# Patient Record
Sex: Male | Born: 1995 | Hispanic: No | Marital: Single | Smoking: Never smoker
Health system: Southern US, Community
[De-identification: ages and names within clinical notes are randomized; demographics above are authoritative.]

## PROBLEM LIST (undated history)

## (undated) DIAGNOSIS — F32A Depression, unspecified: Secondary | ICD-10-CM

## (undated) DIAGNOSIS — F419 Anxiety disorder, unspecified: Secondary | ICD-10-CM

## (undated) DIAGNOSIS — K219 Gastro-esophageal reflux disease without esophagitis: Secondary | ICD-10-CM

## (undated) DIAGNOSIS — F329 Major depressive disorder, single episode, unspecified: Secondary | ICD-10-CM

## (undated) HISTORY — DX: Anxiety disorder, unspecified: F41.9

## (undated) HISTORY — DX: Depression, unspecified: F32.A

## (undated) HISTORY — DX: Gastro-esophageal reflux disease without esophagitis: K21.9

---
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
## 2019-04-18 ENCOUNTER — Ambulatory Visit
Admission: EM | Admit: 2019-04-18 | Discharge: 2019-04-18 | Disposition: A | Discharge status: Home / Self Care | Payer: Self-pay | Attending: Emergency Medicine | Admitting: Emergency Medicine

## 2019-04-18 ENCOUNTER — Other Ambulatory Visit: Payer: Self-pay

## 2019-04-18 DIAGNOSIS — R1013 Epigastric pain: Secondary | ICD-10-CM

## 2019-04-18 DIAGNOSIS — F419 Anxiety disorder, unspecified: Secondary | ICD-10-CM

## 2019-04-18 DIAGNOSIS — M62838 Other muscle spasm: Secondary | ICD-10-CM

## 2019-04-18 DIAGNOSIS — F5105 Insomnia due to other mental disorder: Secondary | ICD-10-CM

## 2019-04-18 MED ORDER — OMEPRAZOLE 20 MG PO CPDR: 20.0000 mg | DELAYED_RELEASE_CAPSULE | Freq: Every day | ORAL | 0 refills | Status: DC

## 2019-04-18 MED ORDER — HYDROXYZINE HCL 25 MG PO TABS: 25.0000 mg | ORAL_TABLET | Freq: Every evening | ORAL | 0 refills | Status: AC | PRN

## 2019-04-18 MED ORDER — CYCLOBENZAPRINE HCL 5 MG PO TABS: 5.0000 mg | ORAL_TABLET | Freq: Two times a day (BID) | ORAL | 0 refills | Status: AC | PRN

## 2019-04-18 NOTE — ED Provider Notes (Signed)
[facility]    CSN: [id number] Arrival date & time: 04/18/19  1725     History   Chief Complaint Chief Complaint  Patient presents with   Chest Pain    HPI Gregory Wu is a 23 y.o. male with self-reported history of anxiety presenting for 1 week of right lower shoulder muscle spasms and bilateral anterior chest/upper abdominal discomfort.  Patient denies specific aggravating or alleviating factors for muscle spasms.  States that he notices a burning sensation specifically over his left upper abdomen again it radiates across his chest.  Patient denies retrosternal chest pain, shortness of breath, wheezing.  Patient states that this sensation is worse after eating salty foods.  Denies known history of GERD.  Patient also noting difficulty sleeping second to his anxiety.  Patient denies history of SI/HI, denies this currently.  Patient has not tried anything for any of his chief concerns today.   History reviewed. No pertinent past medical history.  Patient Active Problem List   Diagnosis Date Noted   Acute gangrenous appendicitis 06/21/2014    Past Surgical History:  Procedure Laterality Date   LAPAROSCOPIC APPENDECTOMY N/A 06/21/2014   Procedure: APPENDECTOMY LAPAROSCOPIC;  Surgeon: Judie PetitM. Leonia CoronaShuaib Farooqui, MD;  Location: [facility];  Service: Pediatrics;  Laterality: N/A;       Home Medications    Prior to Admission medications   Medication Sig Start Date End Date Taking? Authorizing Provider  cyclobenzaprine (FLEXERIL) 5 MG tablet Take 1 tablet (5 mg total) by mouth 2 (two) times daily as needed for up to 5 days for muscle spasms. Do NOT take with hydroxyzine 04/18/19 04/23/19  Hall-Potvin, [place], PA-C  hydrOXYzine (ATARAX/VISTARIL) 25 MG tablet Take 1 tablet (25 mg total) by mouth at bedtime as needed. Do NOT take with cyclobenzaprine 04/18/19   Hall-Potvin, [place], PA-C  omeprazole (PRILOSEC) 20 MG capsule Take 1 capsule (20 mg total) by mouth daily. 04/18/19    Hall-Potvin, [place], PA-C    Family History No family history on file.  Social History Social History   Tobacco Use   Smoking status: Never Smoker   Smokeless tobacco: Never Used  Substance Use Topics   Alcohol use: No   Drug use: No     Allergies   Patient has no known allergies.   Review of Systems Review of Systems  Constitutional: Negative for activity change, appetite change, fatigue and fever.  HENT: Negative for trouble swallowing and voice change.   Respiratory: Negative for cough and shortness of breath.   Cardiovascular: Negative for chest pain, palpitations and leg swelling.  Gastrointestinal: Positive for abdominal pain. Negative for abdominal distention, blood in stool, constipation, diarrhea, nausea and vomiting.  Musculoskeletal: Negative for arthralgias and myalgias.  Skin: Negative for rash and wound.  Neurological: Negative for speech difficulty and headaches.  Psychiatric/Behavioral: Positive for sleep disturbance. Negative for decreased concentration and suicidal ideas. The patient is nervous/anxious.   All other systems reviewed and are negative.    Physical Exam Triage Vital Signs ED Triage Vitals  Enc Vitals Group     BP 04/18/19 1745 135/76     Pulse Rate 04/18/19 1745 82     Resp 04/18/19 1745 18     Temp 04/18/19 1745 98.8 F (37.1 C)     Temp Source 04/18/19 1745 Oral     SpO2 04/18/19 1745 97 %     Weight --      Height --      Head Circumference --  Peak Flow --      Pain Score 04/18/19 1746 0     Pain Loc --      Pain Edu? --      Excl. in [place]? --    No data found.  Updated Vital Signs BP 135/76 (BP Location: Left Arm)    Pulse 82    Temp 98.8 F (37.1 C) (Oral)    Resp 18    SpO2 97%   Visual Acuity Right Eye Distance:   Left Eye Distance:   Bilateral Distance:    Right Eye Near:   Left Eye Near:    Bilateral Near:     Physical Exam Constitutional:      General: He is not in acute distress. HENT:      Head: Normocephalic and atraumatic.  Eyes:     General: No scleral icterus.    Pupils: Pupils are equal, round, and reactive to light.  Neck:     Musculoskeletal: Normal range of motion and neck supple.     Thyroid: No thyromegaly.     Vascular: No JVD.     Trachea: No tracheal deviation.  Cardiovascular:     Rate and Rhythm: Normal rate and regular rhythm.     Pulses:          Carotid pulses are 2+ on the right side and 2+ on the left side.      Radial pulses are 2+ on the right side and 2+ on the left side.  Pulmonary:     Effort: Pulmonary effort is normal. No respiratory distress.     Breath sounds: Normal breath sounds.  Chest:     Chest wall: No deformity, tenderness, crepitus or edema. There is no dullness to percussion.  Abdominal:     General: Bowel sounds are normal. There is no abdominal bruit.     Palpations: Abdomen is soft. There is no hepatomegaly or splenomegaly.     Tenderness: There is no guarding or rebound.     Comments: Mildly tender across upper abdomen  Musculoskeletal:     Comments: Right inferomedial shoulder with trace edema: No ecchymosis, erythema, or warmth.  Mildly tender to palpation.  Shoulders with full active ROM 5/5 strength bilaterally.  NVI  Lymphadenopathy:     Cervical: No cervical adenopathy.  Skin:    Capillary Refill: Capillary refill takes less than 2 seconds.     Coloration: Skin is not jaundiced or pale.  Neurological:     General: No focal deficit present.     Mental Status: He is alert and oriented to person, place, and time.  Psychiatric:        Mood and Affect: Mood is not anxious.        Behavior: Behavior normal.      UC Treatments / Results  Labs (all labs ordered are listed, but only abnormal results are displayed) Labs Reviewed - No data to display  EKG   Radiology No results found.  Procedures Procedures (including critical care time)  Medications Ordered in UC Medications - No data to display  Initial  Impression / Assessment and Plan / UC Course  I have reviewed the triage vital signs and the nursing notes.  Pertinent labs & imaging results that were available during my care of the patient were reviewed by me and considered in my medical decision making (see chart for details).     1.  Muscle spasm right shoulder We will try cyclobenzaprine.  Low concern for electrolyte  imbalance/dehydration given patient's vitals, exam, history.  2.  Hypersomnia associated with anxiety Will trial hydroxyzine for anxious state at bedtime.  Patient to follow-up with PCP for further evaluation/management.  3.  Epigastric burning sensation Will trial PPI.  Patient to keep symptom log and follow-up with PCP.  Return precautions discussed, patient verbalized understanding and is agreeable to plan. Final Clinical Impressions(s) / UC Diagnoses   Final diagnoses:  Muscle spasm of right shoulder  Hyposomnia, insomnia, or sleeplessness associated with anxiety  Epigastric burning sensation     Discharge Instructions     Take medications as prescribed/discussed today. Important to schedule PCP appointment for routine care. Return for worsening symptoms, chest pain, difficulty breathing.    ED Prescriptions    Medication Sig Dispense Auth. Provider   hydrOXYzine (ATARAX/VISTARIL) 25 MG tablet Take 1 tablet (25 mg total) by mouth at bedtime as needed. Do NOT take with cyclobenzaprine 12 tablet Hall-Potvin, [place], PA-C   cyclobenzaprine (FLEXERIL) 5 MG tablet Take 1 tablet (5 mg total) by mouth 2 (two) times daily as needed for up to 5 days for muscle spasms. Do NOT take with hydroxyzine 10 tablet Hall-Potvin, [place], PA-C   omeprazole (PRILOSEC) 20 MG capsule Take 1 capsule (20 mg total) by mouth daily. 30 capsule Hall-Potvin, [place], PA-C     Controlled Substance Prescriptions [place] Controlled Substance Registry consulted? Not Applicable   Shea EvansHall-Potvin, Jarielys Girardot, [place]-C 04/18/19 [id number]

## 2019-04-18 NOTE — ED Triage Notes (Signed)
Pt c/o rib pain all the way around, front to back x1wk, denies injury.

## 2019-04-18 NOTE — Discharge Instructions (Signed)
Take medications as prescribed/discussed today. Important to schedule PCP appointment for routine care. Return for worsening symptoms, chest pain, difficulty breathing.

## 2019-04-25 ENCOUNTER — Other Ambulatory Visit: Payer: Self-pay

## 2019-04-25 ENCOUNTER — Ambulatory Visit: Payer: Self-pay | Admitting: Internal Medicine

## 2019-04-25 ENCOUNTER — Encounter: Payer: Self-pay | Admitting: Internal Medicine

## 2019-04-25 VITALS — BP 137/80 | HR 100 | Temp 98.5°F | Ht 74.0 in | Wt 141.4 lb

## 2019-04-25 DIAGNOSIS — K219 Gastro-esophageal reflux disease without esophagitis: Secondary | ICD-10-CM

## 2019-04-25 DIAGNOSIS — F411 Generalized anxiety disorder: Secondary | ICD-10-CM

## 2019-04-25 DIAGNOSIS — Z79899 Other long term (current) drug therapy: Secondary | ICD-10-CM

## 2019-04-25 NOTE — Progress Notes (Signed)
   CC: epigastric pain, anxiety  HPI:  Gregory Wu is a 23 y.o. otherwise healthy gentleman who presents to establish care in the [facility] for further management of epigastric pain and anxiety. He was seen in the ED a week ago for epigastric pain and was started on Omeprazole 20 mg daily. His symptoms have improved since then, but will still have intermittent burning, particularly with spicy foods.   Patient also endorses symptoms of anxiety he has had ever since high school. Endorses excessive worry about life situations, difficulty falling asleep at night due to racing thoughts and occasional difficulty concentrating. He has worked with a Social worker in the past which he found helpful.   Family History: parents and younger sister are both healthy.   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 Endorses almost daily marijuana use, but has not smoked any since ED visit.   Past Surgical History:  Procedure Laterality Date  . LAPAROSCOPIC APPENDECTOMY N/A 06/21/2014   Procedure: APPENDECTOMY LAPAROSCOPIC;  Surgeon: Jerilynn Mages. Gerald Stabs, MD;  Location: [place];  Service: Pediatrics;  Laterality: N/A;     Review of Systems:   Review of Systems  Constitutional: Negative for chills and fever.  HENT: Negative for sinus pain and sore throat.   Eyes: Negative for blurred vision.  Respiratory: Negative for cough and shortness of breath.   Cardiovascular: Negative for chest pain and palpitations.  Gastrointestinal: Negative for blood in stool, constipation and melena.  Genitourinary: Negative for dysuria and hematuria.  Musculoskeletal: Negative for joint pain.  Neurological: Negative for dizziness, sensory change and headaches.    Physical Exam:  Vitals:   04/25/19 1044  BP: 137/80  Pulse: 100  Temp: 98.5 F (36.9 C)  TempSrc: Oral  SpO2: 100%  Weight: 141 lb 6.4 oz (64.1 kg)   Physical Exam Constitutional:    Appearance: Normal appearance. He is normal weight.  Eyes:     Conjunctiva/sclera: Conjunctivae normal.  Neck:     Musculoskeletal: Normal range of motion and neck supple.  Cardiovascular:     Rate and Rhythm: Normal rate and regular rhythm.     Pulses: Normal pulses.  Pulmonary:     Effort: Pulmonary effort is normal.     Breath sounds: Normal breath sounds.  Abdominal:     General: Abdomen is flat. Bowel sounds are normal. There is no distension.     Palpations: Abdomen is soft.     Tenderness: There is abdominal tenderness in the epigastric area.  Musculoskeletal: Normal range of motion.     Right lower leg: No edema.     Left lower leg: No edema.  Lymphadenopathy:     Cervical: No cervical adenopathy.  Skin:    General: Skin is warm and dry.  Neurological:     General: No focal deficit present.     Mental Status: He is alert and oriented to person, place, and time.      Assessment & Plan:   See Encounters Tab for problem based charting.  Patient discussed with Dr. Dareen Piano

## 2019-04-25 NOTE — Patient Instructions (Addendum)
Gregory Wu,  It was great meeting you! We are glad to have you establishing your care with [place].   Today we discussed:  Acid reflux - Since your symptoms are improving on the omeprazole, we will continue this dosing and see how you do over the next 2 months. Try to avoid foods that you notice make your symptoms worse. You can also take over-the-counter antacids such as Tums to help relieve your symptoms if needed.   Your anxiety - I'm glad you have counselor you can talk to. You have decided to reach back out to her before starting any medication which is perfectly reasonable. We will check in on this when you follow-up in about 2 months.   Please call sooner if you have any questions or concerns.   Take care!  Dr. Koleen Distance  Gastroesophageal Reflux Disease, Adult Gastroesophageal reflux (GER) happens when acid from the stomach flows up into the tube that connects the mouth and the stomach (esophagus). Normally, food travels down the esophagus and stays in the stomach to be digested. However, when a person has GER, food and stomach acid sometimes move back up into the esophagus. If this becomes a more serious problem, the person may be diagnosed with a disease called gastroesophageal reflux disease (GERD). GERD occurs when the reflux:  Happens often.  Causes frequent or severe symptoms.  Causes problems such as damage to the esophagus. When stomach acid comes in contact with the esophagus, the acid may cause soreness (inflammation) in the esophagus. Over time, GERD may create small holes (ulcers) in the lining of the esophagus. What are the causes? This condition is caused by a problem with the muscle between the esophagus and the stomach (lower esophageal sphincter, or LES). Normally, the LES muscle closes after food passes through the esophagus to the stomach. When the LES is weakened or abnormal, it does not close properly, and that allows food and stomach acid to go back up into the  esophagus. The LES can be weakened by certain dietary substances, medicines, and medical conditions, including:  Tobacco use.  Pregnancy.  Having a hiatal hernia.  Alcohol use.  Certain foods and beverages, such as coffee, chocolate, onions, and peppermint. What increases the risk? You are more likely to develop this condition if you:  Have an increased body weight.  Have a connective tissue disorder.  Use NSAID medicines. What are the signs or symptoms? Symptoms of this condition include:  Heartburn.  Difficult or painful swallowing.  The feeling of having a lump in the throat.  Abitter taste in the mouth.  Bad breath.  Having a large amount of saliva.  Having an upset or bloated stomach.  Belching.  Chest pain. Different conditions can cause chest pain. Make sure you see your health care provider if you experience chest pain.  Shortness of breath or wheezing.  Ongoing (chronic) cough or a night-time cough.  Wearing away of tooth enamel.  Weight loss. How is this diagnosed? Your health care provider will take a medical history and perform a physical exam. To determine if you have mild or severe GERD, your health care provider may also monitor how you respond to treatment. You may also have tests, including:  A test to examine your stomach and esophagus with a small camera (endoscopy).  A test thatmeasures the acidity level in your esophagus.  A test thatmeasures how much pressure is on your esophagus.  A barium swallow or modified barium swallow test to show the shape, size,  and functioning of your esophagus. How is this treated? The goal of treatment is to help relieve your symptoms and to prevent complications. Treatment for this condition may vary depending on how severe your symptoms are. Your health care provider may recommend:  Changes to your diet.  Medicine.  Surgery. Follow these instructions at home: Eating and drinking   Follow a  diet as recommended by your health care provider. This may involve avoiding foods and drinks such as: ? Coffee and tea (with or without caffeine). ? Drinks that containalcohol. ? Energy drinks and sports drinks. ? Carbonated drinks or sodas. ? Chocolate and cocoa. ? Peppermint and mint flavorings. ? Garlic and onions. ? Horseradish. ? Spicy and acidic foods, including peppers, chili powder, curry powder, vinegar, hot sauces, and barbecue sauce. ? Citrus fruit juices and citrus fruits, such as oranges, lemons, and limes. ? Tomato-based foods, such as red sauce, chili, salsa, and pizza with red sauce. ? Fried and fatty foods, such as donuts, french fries, potato chips, and high-fat dressings. ? High-fat meats, such as hot dogs and fatty cuts of red and white meats, such as rib eye steak, sausage, ham, and bacon. ? High-fat dairy items, such as whole milk, butter, and cream cheese.  Eat small, frequent meals instead of large meals.  Avoid drinking large amounts of liquid with your meals.  Avoid eating meals during the 2-3 hours before bedtime.  Avoid lying down right after you eat.  Do not exercise right after you eat. Lifestyle   Do not use any products that contain nicotine or tobacco, such as cigarettes, e-cigarettes, and chewing tobacco. If you need help quitting, ask your health care provider.  Try to reduce your stress by using methods such as yoga or meditation. If you need help reducing stress, ask your health care provider.  If you are overweight, reduce your weight to an amount that is healthy for you. Ask your health care provider for guidance about a safe weight loss goal. General instructions  Pay attention to any changes in your symptoms.  Take over-the-counter and prescription medicines only as told by your health care provider. Do not take aspirin, ibuprofen, or other NSAIDs unless your health care provider told you to do so.  Wear loose-fitting clothing. Do not  wear anything tight around your waist that causes pressure on your abdomen.  Raise (elevate) the head of your bed about 6 inches (15 cm).  Avoid bending over if this makes your symptoms worse.  Keep all follow-up visits as told by your health care provider. This is important. Contact a health care provider if:  You have: ? New symptoms. ? Unexplained weight loss. ? Difficulty swallowing or it hurts to swallow. ? Wheezing or a persistent cough. ? A hoarse voice.  Your symptoms do not improve with treatment. Get help right away if you:  Have pain in your arms, neck, jaw, teeth, or back.  Feel sweaty, dizzy, or light-headed.  Have chest pain or shortness of breath.  Vomit and your vomit looks like blood or coffee grounds.  Faint.  Have stool that is bloody or black.  Cannot swallow, drink, or eat. Summary  Gastroesophageal reflux happens when acid from the stomach flows up into the esophagus. GERD is a disease in which the reflux happens often, causes frequent or severe symptoms, or causes problems such as damage to the esophagus.  Treatment for this condition may vary depending on how severe your symptoms are. Your health care provider  may recommend diet and lifestyle changes, medicine, or surgery.  Contact a health care provider if you have new or worsening symptoms.  Take over-the-counter and prescription medicines only as told by your health care provider. Do not take aspirin, ibuprofen, or other NSAIDs unless your health care provider told you to do so.  Keep all follow-up visits as told by your health care provider. This is important. This information is not intended to replace advice given to you by your health care provider. Make sure you discuss any questions you have with your health care provider. Document Released: 06/09/2005 Document Revised: 03/08/2018 Document Reviewed: 03/08/2018 Elsevier Patient Education  2020 [employer]

## 2019-04-28 ENCOUNTER — Encounter: Payer: Self-pay | Admitting: Internal Medicine

## 2019-04-28 DIAGNOSIS — F411 Generalized anxiety disorder: Secondary | ICD-10-CM | POA: Insufficient documentation

## 2019-04-28 DIAGNOSIS — K219 Gastro-esophageal reflux disease without esophagitis: Secondary | ICD-10-CM | POA: Insufficient documentation

## 2019-04-28 NOTE — Assessment & Plan Note (Signed)
Patient endorses improvement since starting Omeprazole 20 mg daily 1 week ago. He denies any weight loss or dysphagia. Encouraged him to make note of which foods trigger his symptoms and try to avoid them when he can.  - Will continue Omeprazole 20 mg daily; OTC antacids PRN - Return in 4-6 weeks to evaluate symptom control

## 2019-04-28 NOTE — Assessment & Plan Note (Signed)
GAD-7 score is 12 today. Has had success with counseling in the past. We discussed initiating SSRI, but he would prefer to try working with his counselor again prior to starting any medications.  Will re-evaluate symptoms at follow-up in 4-6 weeks.

## 2019-04-29 ENCOUNTER — Encounter (HOSPITAL_COMMUNITY): Payer: Self-pay

## 2019-04-29 ENCOUNTER — Emergency Department (HOSPITAL_COMMUNITY): Payer: Self-pay

## 2019-04-29 ENCOUNTER — Emergency Department (HOSPITAL_COMMUNITY)
Admission: EM | Admit: 2019-04-29 | Discharge: 2019-04-30 | Disposition: A | Discharge status: Home / Self Care | Payer: Self-pay | Attending: Emergency Medicine | Admitting: Emergency Medicine

## 2019-04-29 ENCOUNTER — Other Ambulatory Visit: Payer: Self-pay

## 2019-04-29 DIAGNOSIS — K2970 Gastritis, unspecified, without bleeding: Secondary | ICD-10-CM | POA: Insufficient documentation

## 2019-04-29 DIAGNOSIS — K2900 Acute gastritis without bleeding: Secondary | ICD-10-CM

## 2019-04-29 DIAGNOSIS — R1013 Epigastric pain: Secondary | ICD-10-CM | POA: Insufficient documentation

## 2019-04-29 DIAGNOSIS — R197 Diarrhea, unspecified: Secondary | ICD-10-CM | POA: Insufficient documentation

## 2019-04-29 DIAGNOSIS — Z20828 Contact with and (suspected) exposure to other viral communicable diseases: Secondary | ICD-10-CM | POA: Insufficient documentation

## 2019-04-29 LAB — URINALYSIS, ROUTINE W REFLEX MICROSCOPIC
Bilirubin Urine: NEGATIVE
Glucose, UA: NEGATIVE mg/dL
Hgb urine dipstick: NEGATIVE
Ketones, ur: NEGATIVE mg/dL
Leukocytes,Ua: NEGATIVE
Nitrite: NEGATIVE
Protein, ur: NEGATIVE mg/dL
Specific Gravity, Urine: 1.004 — ABNORMAL LOW (ref 1.005–1.030)
pH: 7 (ref 5.0–8.0)

## 2019-04-29 LAB — COMPREHENSIVE METABOLIC PANEL
ALT: 26 U/L (ref 0–44)
AST: 32 U/L (ref 15–41)
Albumin: 4.9 g/dL (ref 3.5–5.0)
Alkaline Phosphatase: 84 U/L (ref 38–126)
Anion gap: 13 (ref 5–15)
BUN: 9 mg/dL (ref 6–20)
CO2: 24 mmol/L (ref 22–32)
Calcium: 9.8 mg/dL (ref 8.9–10.3)
Chloride: 102 mmol/L (ref 98–111)
Creatinine, Ser: 1.14 mg/dL (ref 0.61–1.24)
GFR calc Af Amer: >60 mL/min (ref >60)
GFR calc non Af Amer: >60 mL/min (ref >60)
Glucose, Bld: 136 mg/dL — ABNORMAL HIGH (ref 70–99)
Potassium: 3.3 mmol/L — ABNORMAL LOW (ref 3.5–5.1)
Sodium: 139 mmol/L (ref 135–145)
Total Bilirubin: 1 mg/dL (ref 0.3–1.2)
Total Protein: 7.2 g/dL (ref 6.5–8.1)

## 2019-04-29 LAB — CBC
HCT: 47.3 % (ref 39.0–52.0)
Hemoglobin: 16.4 g/dL (ref 13.0–17.0)
MCH: 32 pg (ref 26.0–34.0)
MCHC: 34.7 g/dL (ref 30.0–36.0)
MCV: 92.4 fL (ref 80.0–100.0)
Platelets: 148 10*3/uL — ABNORMAL LOW (ref 150–400)
RBC: 5.12 MIL/uL (ref 4.22–5.81)
RDW: 12 % (ref 11.5–15.5)
WBC: 7.5 10*3/uL (ref 4.0–10.5)
nRBC: 0 % (ref 0.0–0.2)

## 2019-04-29 LAB — LIPASE, BLOOD: Lipase: 20 U/L (ref 11–51)

## 2019-04-29 MED ORDER — SODIUM CHLORIDE 0.9% FLUSH: 3.0000 mL | Freq: Once | INTRAVENOUS | Status: DC

## 2019-04-29 MED ORDER — ALUM & MAG HYDROXIDE-SIMETH 200-200-20 MG/5ML PO SUSP
30.0000 mL | Freq: Once | ORAL | Status: AC
  Administered 2019-04-30: 30 mL via ORAL
  Filled 2019-04-29: qty 30

## 2019-04-29 MED ORDER — ONDANSETRON 4 MG PO TBDP
4.0000 mg | ORAL_TABLET | Freq: Once | ORAL | Status: AC
  Administered 2019-04-30: 4 mg via ORAL
  Filled 2019-04-29: qty 1

## 2019-04-29 NOTE — ED Provider Notes (Signed)
[facility] EMERGENCY DEPARTMENT Provider Note   CSN: [id number] Arrival date & time: 04/29/19  2126    History   Chief Complaint Chief Complaint  Patient presents with  . Abdominal Pain    HPI Gregory Wu is a 23 y.o. male with a hx of GERD, anxiety, appendectomy presents to the Emergency Department complaining of intermittent epigastric abd burning that radiates into his chest onset 2 weeks ago.  Pt reports symptoms worsened yesterday.  He reports eating oatmeal, crackers and watermelon today. Associated symptoms include 1 episode of NBNB emesis just before arrival and some intermittent loose stools which began almost 1 month ago.  Pt reports pain is worse after eating and at night. Pt reports laying flat seems to worsen his symptoms.  Pt has been seen by UC and PCP for this.  Prescribed omeprazole with some relief.  He has never seen GI.  Pt denies fever, headache, neck pain, weakness, syncope, dysuria.  Pt denies recent international travel, camping, new or contaminated foods.  Pt reports occasional alcohol usage with his last intake being last weekend.  He denies regular NSAID usage.  Pt does report continued anxiety.  He reports diarrhea usually occurs with increased anxiety.  He has been taking hydroxyzine with some relief.  Patient also reports some increased anxiety in the last few weeks.      The history is provided by the patient and medical records. No language interpreter was used.    History reviewed. No pertinent past medical history.  Patient Active Problem List   Diagnosis Date Noted  . Gastroesophageal reflux disease 04/28/2019  . Generalized anxiety disorder 04/28/2019  . Acute gangrenous appendicitis 06/21/2014    Past Surgical History:  Procedure Laterality Date  . LAPAROSCOPIC APPENDECTOMY N/A 06/21/2014   Procedure: APPENDECTOMY LAPAROSCOPIC;  Surgeon: Jerilynn Mages. Gerald Stabs, MD;  Location: [facility];  Service: Pediatrics;  Laterality: N/A;         Home Medications    Prior to Admission medications   Medication Sig Start Date End Date Taking? Authorizing Provider  hydrOXYzine (ATARAX/VISTARIL) 25 MG tablet Take 1 tablet (25 mg total) by mouth at bedtime as needed. Do NOT take with cyclobenzaprine 04/18/19   Hall-Potvin, [place], PA-C  omeprazole (PRILOSEC) 20 MG capsule Take 1 capsule (20 mg total) by mouth daily. 04/18/19   Hall-Potvin, [place], PA-C  ondansetron (ZOFRAN ODT) 4 MG disintegrating tablet 4mg  ODT q4 hours prn nausea/vomit 04/30/19   Estus Krakowski, Jarrett Soho, PA-C  sucralfate (CARAFATE) 1 g tablet Take 1 tablet (1 g total) by mouth 4 (four) times daily -  with meals and at bedtime. 04/30/19   Vanessa Alesi, Jarrett Soho, PA-C    Family History No family history on file.  Social History Social History   Tobacco Use  . Smoking status: Never Smoker  . Smokeless tobacco: Never Used  Substance Use Topics  . Alcohol use: No  . Drug use: No     Allergies   Patient has no known allergies.   Review of Systems Review of Systems  Constitutional: Positive for fatigue. Negative for appetite change, diaphoresis, fever and unexpected weight change.  HENT: Negative for mouth sores.   Eyes: Negative for visual disturbance.  Respiratory: Negative for cough, chest tightness, shortness of breath and wheezing.   Cardiovascular: Positive for chest pain.  Gastrointestinal: Positive for abdominal pain, nausea and vomiting (x1). Negative for constipation and diarrhea.  Endocrine: Negative for polydipsia, polyphagia and polyuria.  Genitourinary: Negative for dysuria, frequency, hematuria and urgency.  Musculoskeletal: Negative for back pain and neck stiffness.  Skin: Negative for rash.  Allergic/Immunologic: Negative for immunocompromised state.  Neurological: Negative for syncope, light-headedness and headaches.  Hematological: Does not bruise/bleed easily.  Psychiatric/Behavioral: Negative for sleep disturbance. The patient is  nervous/anxious.      Physical Exam Updated Vital Signs BP 116/75 (BP Location: Left Arm)   Pulse 79   Temp 97.9 F (36.6 C) (Oral)   Resp 16   SpO2 97%   Physical Exam Vitals signs and nursing note reviewed.  Constitutional:      General: He is not in acute distress.    Appearance: He is not diaphoretic.  HENT:     Head: Normocephalic.  Eyes:     General: No scleral icterus.    Conjunctiva/sclera: Conjunctivae normal.  Neck:     Musculoskeletal: Normal range of motion.  Cardiovascular:     Rate and Rhythm: Normal rate and regular rhythm.     Pulses: Normal pulses.          Radial pulses are 2+ on the right side and 2+ on the left side.  Pulmonary:     Effort: No tachypnea, accessory muscle usage, prolonged expiration, respiratory distress or retractions.     Breath sounds: No stridor.     Comments: Equal chest rise. No increased work of breathing. Abdominal:     General: There is no distension.     Palpations: Abdomen is soft.     Tenderness: There is no abdominal tenderness. There is no guarding or rebound.  Musculoskeletal:     Comments: Moves all extremities equally and without difficulty.  Skin:    General: Skin is warm and dry.     Capillary Refill: Capillary refill takes less than 2 seconds.  Neurological:     Mental Status: He is alert.     GCS: GCS eye subscore is 4. GCS verbal subscore is 5. GCS motor subscore is 6.     Comments: Speech is clear and goal oriented.  Psychiatric:        Mood and Affect: Mood is anxious.      ED Treatments / Results  Labs (all labs ordered are listed, but only abnormal results are displayed) Labs Reviewed  COMPREHENSIVE METABOLIC PANEL - Abnormal; Notable for the following components:      Result Value   Potassium 3.3 (*)    Glucose, Bld 136 (*)    All other components within normal limits  CBC - Abnormal; Notable for the following components:   Platelets 148 (*)    All other components within normal limits   URINALYSIS, ROUTINE W REFLEX MICROSCOPIC - Abnormal; Notable for the following components:   Color, Urine STRAW (*)    Specific Gravity, Urine 1.004 (*)    All other components within normal limits  SARS CORONAVIRUS 2 (HOSPITAL ORDER, PERFORMED IN [place] [facility])  LIPASE, BLOOD     Radiology Dg Chest Port 1 View  Result Date: 04/30/2019 CLINICAL DATA:  Chest pain, epigastric pain EXAM: PORTABLE CHEST 1 VIEW COMPARISON:  None. FINDINGS: The heart size and mediastinal contours are within normal limits. Both lungs are clear. No pneumothorax or effusion the portion of the right costophrenic sulcus is collimated from view. The visualized skeletal structures are unremarkable. IMPRESSION: No active disease. Electronically Signed   By: Kreg ShropshirePrice  DeHay M.D.   On: 04/30/2019 00:05    Procedures Procedures (including critical care time)  Medications Ordered in ED Medications  sodium chloride flush (NS)  0.9 % injection 3 mL (3 mLs Intravenous Not Given 04/30/19 0010)  ondansetron (ZOFRAN-ODT) disintegrating tablet 4 mg (has no administration in time range)  alum & mag hydroxide-simeth (MAALOX/MYLANTA) 200-200-20 MG/5ML suspension 30 mL (30 mLs Oral Given 04/30/19 0014)  ondansetron (ZOFRAN-ODT) disintegrating tablet 4 mg (4 mg Oral Given 04/30/19 0014)  potassium chloride SA (K-DUR) CR tablet 40 mEq (40 mEq Oral Given 04/30/19 0146)     Initial Impression / Assessment and Plan / ED Course  I have reviewed the triage vital signs and the nursing notes.  Pertinent labs & imaging results that were available during my care of the patient were reviewed by me and considered in my medical decision making (see chart for details).        Patient presents to the emergency department with complaints of epigastric pain that occasionally radiates into his chest.  It is described as burning, worse at night and worse when he is anxious.  He has no fevers or known exposures to COVID.  Patient reports one  episode of nonbloody nonbilious emesis prior to arrival.  He is very anxious.  Abdomen is soft and nontender on exam.  Labs are all reassuring.  Chest x-ray without evidence of free air to suggest perforated peptic ulcer.  No elevation in lipase to suggest pancreatitis.  No pain to the right upper quadrant, no elevated liver enzymes to suggest cholecystitis.  Suspect gastritis as the cause of patient's symptoms.  He is taking omeprazole at home.  Will add Carafate.  Will refer to GI for EGD.  No clinical evidence of diverticulitis, colitis.  Patient has history of appendectomy.  Patient given Zofran and GI cocktail with significant improvement.  Mild hypokalemia was noted.  Potassium given.  Approximately 20 minutes after potassium administration, patient had additional bout of emesis.  Suspect this was secondary to potassium.  Will give additional Zofran and p.o. trial with crackers.  02:15AM Patient reports he is feeling better and wishes for discharge home.  He remains without return of abdominal pain.  Final Clinical Impressions(s) / ED Diagnoses   Final diagnoses:  Epigastric pain  Acute gastritis, presence of bleeding unspecified, unspecified gastritis type    ED Discharge Orders         Ordered    sucralfate (CARAFATE) 1 g tablet  3 times daily with meals & bedtime     04/30/19 0138    ondansetron (ZOFRAN ODT) 4 MG disintegrating tablet  Status:  Discontinued     04/30/19 0138    ondansetron (ZOFRAN ODT) 4 MG disintegrating tablet     04/30/19 0139           Cordie Beazley, Dahlia ClientHannah, PA-C 04/30/19 0503    Cardama, Amadeo GarnetPedro Eduardo, MD 04/30/19 [phone]

## 2019-04-29 NOTE — ED Triage Notes (Signed)
Pt states that for the past two weeks he has been feeling bad went to UC and given meds, has now been feeling worse, reports not eating since sat, weakness, abd pain, nasuea, diarrhea, chills.

## 2019-04-30 LAB — SARS CORONAVIRUS 2 BY RT PCR (HOSPITAL ORDER, PERFORMED IN ~~LOC~~ HOSPITAL LAB): SARS Coronavirus 2: NEGATIVE

## 2019-04-30 MED ORDER — ONDANSETRON 4 MG PO TBDP
4.0000 mg | ORAL_TABLET | Freq: Once | ORAL | Status: AC
  Administered 2019-04-30: 02:00:00 4 mg via ORAL
  Filled 2019-04-30: qty 1

## 2019-04-30 MED ORDER — SUCRALFATE 1 G PO TABS: 1.0000 g | ORAL_TABLET | Freq: Three times a day (TID) | ORAL | 0 refills | Status: AC

## 2019-04-30 MED ORDER — ONDANSETRON 4 MG PO TBDP: ORAL_TABLET | ORAL | 0 refills | Status: DC

## 2019-04-30 MED ORDER — ONDANSETRON 4 MG PO TBDP: ORAL_TABLET | ORAL | 0 refills | Status: AC

## 2019-04-30 MED ORDER — POTASSIUM CHLORIDE CRYS ER 20 MEQ PO TBCR
40.0000 meq | EXTENDED_RELEASE_TABLET | Freq: Once | ORAL | Status: AC
  Administered 2019-04-30: 02:00:00 40 meq via ORAL
  Filled 2019-04-30: qty 2

## 2019-04-30 NOTE — ED Notes (Signed)
As pt got into wheelchair to be discharged, pt emesis x1. EDP notified. Pt given zofran, crackers, per EDP. Pt reports decreased nausea.

## 2019-04-30 NOTE — Progress Notes (Signed)
Internal Medicine Clinic Attending  Case discussed with Dr. Bloomfield at the time of the visit.  We reviewed the resident's history and exam and pertinent patient test results.  I agree with the assessment, diagnosis, and plan of care documented in the resident's note.  

## 2019-04-30 NOTE — Discharge Instructions (Addendum)
1. Medications: Continue Omeprazole, use Hydroxyzine as needed for anxiety, take Carafate for burning pain, take zofran for nausea or vomiting, usual home medications 2. Treatment: rest, drink plenty of fluids, advance diet slowly 3. Follow Up: Please followup with your primary doctor and gastroenterology in 1 week for discussion of your diagnoses and further evaluation after today's visit; Please return to the ER for persistent vomiting, worsening pain, high fevers or concerning symptoms

## 2019-04-30 NOTE — ED Notes (Signed)
Pt reports decreased nausea, EPD notified. Pt and EDP ok with pt d/c.

## 2019-04-30 NOTE — ED Notes (Signed)
Discharge instructions discussed with pt. Pt verbalized understanding. Pt stable and ambulatory. No signature pad available. 

## 2019-05-15 ENCOUNTER — Ambulatory Visit (INDEPENDENT_AMBULATORY_CARE_PROVIDER_SITE_OTHER): Payer: Self-pay | Admitting: Gastroenterology

## 2019-05-15 ENCOUNTER — Encounter

## 2019-05-15 ENCOUNTER — Encounter: Payer: Self-pay | Admitting: Gastroenterology

## 2019-05-15 ENCOUNTER — Other Ambulatory Visit: Payer: Self-pay

## 2019-05-15 ENCOUNTER — Other Ambulatory Visit (INDEPENDENT_AMBULATORY_CARE_PROVIDER_SITE_OTHER): Payer: Self-pay

## 2019-05-15 VITALS — BP 114/76 | HR 78 | Temp 97.8°F | Ht 74.0 in | Wt 131.5 lb

## 2019-05-15 DIAGNOSIS — R634 Abnormal weight loss: Secondary | ICD-10-CM

## 2019-05-15 DIAGNOSIS — R1013 Epigastric pain: Secondary | ICD-10-CM

## 2019-05-15 DIAGNOSIS — R1012 Left upper quadrant pain: Secondary | ICD-10-CM

## 2019-05-15 DIAGNOSIS — R110 Nausea: Secondary | ICD-10-CM

## 2019-05-15 LAB — COMPREHENSIVE METABOLIC PANEL
ALT: 18 U/L (ref 0–53)
AST: 19 U/L (ref 0–37)
Albumin: 4.4 g/dL (ref 3.5–5.2)
Alkaline Phosphatase: 71 U/L (ref 39–117)
BUN: 19 mg/dL (ref 6–23)
CO2: 28 mEq/L (ref 19–32)
Calcium: 9.2 mg/dL (ref 8.4–10.5)
Chloride: 105 mEq/L (ref 96–112)
Creatinine, Ser: 0.98 mg/dL (ref 0.40–1.50)
GFR: 95.9 mL/min (ref >60.00)
Glucose, Bld: 74 mg/dL (ref 70–99)
Potassium: 3.7 mEq/L (ref 3.5–5.1)
Sodium: 141 mEq/L (ref 135–145)
Total Bilirubin: 0.5 mg/dL (ref 0.2–1.2)
Total Protein: 6.8 g/dL (ref 6.0–8.3)

## 2019-05-15 LAB — CBC WITH DIFFERENTIAL/PLATELET
Basophils Absolute: 0 10*3/uL (ref 0.0–0.1)
Basophils Relative: 0.4 % (ref 0.0–3.0)
Eosinophils Absolute: 0.1 10*3/uL (ref 0.0–0.7)
Eosinophils Relative: 1.8 % (ref 0.0–5.0)
HCT: 44.3 % (ref 39.0–52.0)
Hemoglobin: 15.1 g/dL (ref 13.0–17.0)
Lymphocytes Relative: 26.9 % (ref 12.0–46.0)
Lymphs Abs: 1.1 10*3/uL (ref 0.7–4.0)
MCHC: 34 g/dL (ref 30.0–36.0)
MCV: 92.2 fl (ref 78.0–100.0)
Monocytes Absolute: 0.3 10*3/uL (ref 0.1–1.0)
Monocytes Relative: 7.8 % (ref 3.0–12.0)
Neutro Abs: 2.7 10*3/uL (ref 1.4–7.7)
Neutrophils Relative %: 63.1 % (ref 43.0–77.0)
Platelets: 229 10*3/uL (ref 150.0–400.0)
RBC: 4.8 Mil/uL (ref 4.22–5.81)
RDW: 12.2 % (ref 11.5–15.5)
WBC: 4.3 10*3/uL (ref 4.0–10.5)

## 2019-05-15 LAB — TSH: TSH: 1.26 u[IU]/mL (ref 0.35–4.50)

## 2019-05-15 LAB — HEMOGLOBIN A1C: Hgb A1c MFr Bld: 5.3 % (ref 4.6–6.5)

## 2019-05-15 LAB — C-REACTIVE PROTEIN: CRP: <1 mg/dL (ref 0.5–20.0)

## 2019-05-15 MED ORDER — OMEPRAZOLE 20 MG PO CPDR: 20.0000 mg | DELAYED_RELEASE_CAPSULE | Freq: Every day | ORAL | 11 refills | Status: AC

## 2019-05-15 NOTE — Progress Notes (Signed)
Chief Complaint:   Referring Provider:  Jose Persia, MD      ASSESSMENT AND PLAN;   #1.  Epi pain/LUQ pain with nausea. D/d PUD, GERD, gastritis, nonulcer dyspepsia, gastroparesis, musculoskeletal etiology, r/o biliary or pancreatic problems.  #2. Wt loss (15lb over 1 month)  Plan: -EGD for further evaluation.  Discussed risks and benefits. -Check CBC, CMP, TSH, CRP, celiac screen, HBA1c. -Continue omeprazole 20mg  po qd #30, 11 refills. -Check Wt every week and record. If continued wt loss, will proceed with CT scan abdo/pelvis with PO/IV contrast. D/w the patient in detail. All the contact numbers were given. Patient knows how to get in touch with [place]. -Hold off on colonoscopy has I believe yield will be low.  He is not having any symptoms. -He does have appointment with Dr. Charleen Kirks.  May have to be screened for depression.     HPI:    Gregory Wu is a 23 y.o. male  With 3-week history of left upper quadrant/epigastric pain associated with nausea and heartburn.  Lately he did not have very good appetite.  Seen in ED, given omeprazole with some resolution of heartburn.  Denies having any vomiting.  No melena or hematochezia.  Has lost 15 pounds over the last 1 month.  Has not been trying to lose weight.  No fever chills or night sweats.  No jaundice dark urine or pale stools.  No diarrhea or constipation.  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  Labs from ED-showed low potassium, questionable low platelets and elevated glucose.  Not sure what to make of that.  I think these need to be repeated.  Had CT Abdo/pelvis in 2015 which showed acute appendicitis.  He had appendicectomy.  COVID-19 testing was negative.  History reviewed. No pertinent past medical history.  Past Surgical History:  Procedure Laterality Date  . LAPAROSCOPIC APPENDECTOMY N/A 06/21/2014   Procedure: APPENDECTOMY LAPAROSCOPIC;  Surgeon: Jerilynn Mages.  Gerald Stabs, MD;  Location: [place];  Service: Pediatrics;  Laterality: N/A;    Family History  Problem Relation Age of Onset  . Colon cancer Neg Hx   . Esophageal cancer Neg Hx     Social History   Tobacco Use  . Smoking status: Never Smoker  . Smokeless tobacco: Never Used  Substance Use Topics  . Alcohol use: No  . Drug use: No    Current Outpatient Medications  Medication Sig Dispense Refill  . hydrOXYzine (ATARAX/VISTARIL) 25 MG tablet Take 1 tablet (25 mg total) by mouth at bedtime as needed. Do NOT take with cyclobenzaprine 12 tablet 0  . Multiple Vitamin (MULTIVITAMIN) tablet Take 1 tablet by mouth daily.    Marland Kitchen omeprazole (PRILOSEC) 20 MG capsule Take 1 capsule (20 mg total) by mouth daily. 30 capsule 0  . ondansetron (ZOFRAN ODT) 4 MG disintegrating tablet 4mg  ODT q4 hours prn nausea/vomit 10 tablet 0  . sucralfate (CARAFATE) 1 g tablet Take 1 tablet (1 g total) by mouth 4 (four) times daily -  with meals and at bedtime. 30 tablet 0   No current facility-administered medications for this visit.     No Known Allergies  Review of Systems:  Constitutional: Denies fever, chills, diaphoresis, has appetite change and fatigue.  HEENT: Denies photophobia, eye pain, redness, hearing loss, ear pain, congestion, sore throat, rhinorrhea, sneezing, mouth sores, neck pain, neck stiffness and tinnitus.   Respiratory: Denies SOB, DOE, cough, chest tightness,  and wheezing.  Cardiovascular: Denies chest pain, palpitations and leg swelling.  Genitourinary: Denies dysuria, urgency, frequency, hematuria, flank pain and difficulty urinating.  Musculoskeletal: Denies myalgias, back pain, joint swelling, arthralgias and gait problem.  Skin: No rash.  Neurological: Denies dizziness, seizures, syncope, weakness, light-headedness, numbness and headaches.  Hematological: Denies adenopathy. Easy bruising, personal or family bleeding history  Psychiatric/Behavioral: No anxiety or depression      Physical Exam:    BP 114/76   Pulse 78   Temp 97.8 F (36.6 C)   Ht 6\' 2"  (1.88 m)   Wt 131 lb 8 oz (59.6 kg)   BMI 16.88 kg/m  Filed Weights   05/15/19 0902  Weight: 131 lb 8 oz (59.6 kg)   Constitutional:  Well-developed, in no acute distress. Psychiatric: Normal mood and affect. Behavior is normal. HEENT: Pupils normal.  Conjunctivae are normal. No scleral icterus. Neck supple.  Cardiovascular: Normal rate, regular rhythm. No edema Pulmonary/chest: Effort normal and breath sounds normal. No wheezing, rales or rhonchi. Abdominal: Soft, nondistended. Nontender. Bowel sounds active throughout. There are no masses palpable. No hepatomegaly. Rectal:  defered Neurological: Alert and oriented to person place and time. Skin: Skin is warm and dry. No rashes noted.  Data Reviewed: I have personally reviewed following labs and imaging studies  CBC: CBC Latest Ref Rng & Units 04/29/2019 06/20/2014  WBC 4.0 - 10.5 K/uL 7.5 15.8(H)  Hemoglobin 13.0 - 17.0 g/dL 16.116.4 09.615.8  Hematocrit 04.539.0 - 52.0 % 47.3 44.6  Platelets 150 - 400 K/uL 148(L) 175    CMP: CMP Latest Ref Rng & Units 04/29/2019 06/20/2014  Glucose 70 - 99 mg/dL 409(W136(H) 119(J116(H)  BUN 6 - 20 mg/dL 9 11  Creatinine 4.780.61 - 1.24 mg/dL 2.951.14 6.210.80  Sodium 308135 - 145 mmol/L 139 135(L)  Potassium 3.5 - 5.1 mmol/L 3.3(L) 3.9  Chloride 98 - 111 mmol/L 102 95(L)  CO2 22 - 32 mmol/L 24 24  Calcium 8.9 - 10.3 mg/dL 9.8 9.4  Total Protein 6.5 - 8.1 g/dL 7.2 7.7  Total Bilirubin 0.3 - 1.2 mg/dL 1.0 1.0  Alkaline Phos 38 - 126 U/L 84 186(H)  AST 15 - 41 U/L 32 31  ALT 0 - 44 U/L 26 20      Radiology Studies: Dg Chest Port 1 View  Result Date: 04/30/2019 CLINICAL DATA:  Chest pain, epigastric pain EXAM: PORTABLE CHEST 1 VIEW COMPARISON:  None. FINDINGS: The heart size and mediastinal contours are within normal limits. Both lungs are clear. No pneumothorax or effusion the portion of the right costophrenic sulcus is collimated from  view. The visualized skeletal structures are unremarkable. IMPRESSION: No active disease. Electronically Signed   By: Kreg ShropshirePrice  DeHay M.D.   On: 04/30/2019 00:05      Edman Circleaj Ted Goodner, MD 05/15/2019, 9:10 AM  Cc: Verdene LennertBasaraba, Iulia, MD

## 2019-05-15 NOTE — Patient Instructions (Signed)
If you are age 22 or older, your body mass index should be between 23-30. Your Body mass index is 16.88 kg/m. If this is out of the aforementioned range listed, please consider follow up with your Primary Care Provider.  If you are age 28 or younger, your body mass index should be between 19-25. Your Body mass index is 16.88 kg/m. If this is out of the aformentioned range listed, please consider follow up with your Primary Care Provider.   Please go to the lab at [facility] ([place] You will need to go to level "B", you do not need an appointment for this. Hours available are 7:30 am - 4:30 pm.   You have been scheduled for an endoscopy. Please follow written instructions given to you at your visit today. If you use inhalers (even only as needed), please bring them with you on the day of your procedure. Your physician has requested that you go to www.startemmi.com and enter the access code given to you at your visit today. This web site gives a general overview about your procedure. However, you should still follow specific instructions given to you by our office regarding your preparation for the procedure.  We have sent the following medications to your pharmacy for you to pick up at your convenience: Omeprazole 20 mg   Thank you,  Dr. Jackquline Denmark

## 2019-05-17 ENCOUNTER — Telehealth: Payer: Self-pay

## 2019-05-17 LAB — CELIAC PANEL 10
Antigliadin Abs, IgA: 4 units (ref 0–19)
Endomysial IgA: NEGATIVE
Gliadin IgG: 3 units (ref 0–19)
IgA/Immunoglobulin A, Serum: 246 mg/dL (ref 90–386)
Tissue Transglut Ab: <2 U/mL (ref 0–5)
Transglutaminase IgA: <2 U/mL (ref 0–3)

## 2019-05-17 NOTE — Telephone Encounter (Signed)
Covid-19 screening questions   Do you now or have you had a fever in the last 14 days? NO   Do you have any respiratory symptoms of shortness of breath or cough now or in the last 14 days? NO  Do you have any family members or close contacts with diagnosed or suspected Covid-19 in the past 14 days? NO  Have you been tested for Covid-19 and found to be positive? NO        

## 2019-05-18 ENCOUNTER — Ambulatory Visit (AMBULATORY_SURGERY_CENTER): Payer: Self-pay | Admitting: Gastroenterology

## 2019-05-18 ENCOUNTER — Other Ambulatory Visit: Payer: Self-pay

## 2019-05-18 ENCOUNTER — Encounter: Payer: Self-pay | Admitting: Gastroenterology

## 2019-05-18 VITALS — BP 106/61 | HR 50 | Temp 98.2°F | Resp 13 | Ht 74.0 in | Wt 131.0 lb

## 2019-05-18 DIAGNOSIS — R1013 Epigastric pain: Secondary | ICD-10-CM

## 2019-05-18 DIAGNOSIS — K2970 Gastritis, unspecified, without bleeding: Secondary | ICD-10-CM

## 2019-05-18 MED ORDER — SODIUM CHLORIDE 0.9 % IV SOLN: 500.0000 mL | Freq: Once | INTRAVENOUS | Status: DC

## 2019-05-18 NOTE — Op Note (Signed)
[facility] Patient Name: Gregory GrumblingMoises Wu Procedure Date: 05/18/2019 3:12 PM MRN: [id number] Endoscopist: Lynann Bolognaajesh Harshil Cavallaro , MD Age: 23 Referring MD:  Date of Birth: 06-30-1996 Gender: Male Account #: [id number] Procedure:                Upper GI endoscopy Indications:              #1. Epi pain/LUQ pain with nausea.                           #2. Wt loss (15lb over 1 month) Medicines:                Monitored Anesthesia Care Procedure:                Pre-Anesthesia Assessment:                           - Prior to the procedure, a History and Physical                            was performed, and patient medications and                            allergies were reviewed. The patient's tolerance of                            previous anesthesia was also reviewed. The risks                            and benefits of the procedure and the sedation                            options and risks were discussed with the patient.                            All questions were answered, and informed consent                            was obtained. Prior Anticoagulants: The patient has                            taken no previous anticoagulant or antiplatelet                            agents. ASA Grade Assessment: I - A normal, healthy                            patient. After reviewing the risks and benefits,                            the patient was deemed in satisfactory condition to                            undergo the procedure.  After obtaining informed consent, the endoscope was                            passed under direct vision. Throughout the                            procedure, the patient's blood pressure, pulse, and                            oxygen saturations were monitored continuously. The                            Endoscope was introduced through the mouth, and                            advanced to the second part of duodenum. The upper                          GI endoscopy was accomplished without difficulty.                            The patient tolerated the procedure well. Scope In: Scope Out: Findings:                 The examined esophagus was normal.                           The Z-line was regular and was found 40 cm from the                            incisors.                           Localized mild inflammation characterized by                            erythema was found in the gastric antrum. Biopsies                            were taken with a cold forceps for histology.                           The examined duodenum was normal. Biopsies for                            histology were taken with a cold forceps for                            evaluation of celiac disease. Estimated blood loss:                            none. Complications:            No immediate complications. Estimated Blood Loss:     Estimated blood loss: none. Impression:               -  Mild gastritis.                           - Otherwise normal EGD. Recommendation:           - Patient has a contact number available for                            emergencies. The signs and symptoms of potential                            delayed complications were discussed with the                            patient. Return to normal activities tomorrow.                            Written discharge instructions were provided to the                            patient.                           - Resume previous diet.                           - Continue omeprazole 20 mg p.o. once a day until                            follow-up visit.                           - Await pathology results.                           - No aspirin, ibuprofen, naproxen, or other                            non-steroidal anti-inflammatory drugs.                           - Return to GI clinic in 8 weeks. Patient is to                            report early if he has any  further weight loss. Lynann Bologna, MD 05/18/2019 3:32:21 PM This report has been signed electronically.

## 2019-05-18 NOTE — Patient Instructions (Signed)
Await pathology results.  Continue omeprazole 20 mg once a day until follow up visit.  No aspirin, ibuprofen, naproxen or other non steroidal anti inflammatory medications.  Return to GI clinic in 8 weeks.YOU HAD AN ENDOSCOPIC PROCEDURE TODAY AT [facility]:   Refer to the procedure report that was given to you for any specific questions about what was found during the examination.  If the procedure report does not answer your questions, please call your gastroenterologist to clarify.  If you requested that your care partner not be given the details of your procedure findings, then the procedure report has been included in a sealed envelope for you to review at your convenience later.  YOU SHOULD EXPECT: Some feelings of bloating in the abdomen. Passage of more gas than usual.  Walking can help get rid of the air that was put into your GI tract during the procedure and reduce the bloating. If you had a lower endoscopy (such as a colonoscopy or flexible sigmoidoscopy) you may notice spotting of blood in your stool or on the toilet paper. If you underwent a bowel prep for your procedure, you may not have a normal bowel movement for a few days.  Please Note:  You might notice some irritation and congestion in your nose or some drainage.  This is from the oxygen used during your procedure.  There is no need for concern and it should clear up in a day or so.  SYMPTOMS TO REPORT IMMEDIATELY:    Following upper endoscopy (EGD)  Vomiting of blood or coffee ground material  New chest pain or pain under the shoulder blades  Painful or persistently difficult swallowing  New shortness of breath  Fever of 100F or higher  Black, tarry-looking stools  For urgent or emergent issues, a gastroenterologist can be reached at any hour by calling [phone].   DIET:  We do recommend a small meal at first, but then you may proceed to your regular diet.  Drink plenty of fluids but you should  avoid alcoholic beverages for 24 hours.  ACTIVITY:  You should plan to take it easy for the rest of today and you should NOT DRIVE or use heavy machinery until tomorrow (because of the sedation medicines used during the test).    FOLLOW UP: Our staff will call the number listed on your records 48-72 hours following your procedure to check on you and address any questions or concerns that you may have regarding the information given to you following your procedure. If we do not reach you, we will leave a message.  We will attempt to reach you two times.  During this call, we will ask if you have developed any symptoms of COVID 19. If you develop any symptoms (ie: fever, flu-like symptoms, shortness of breath, cough etc.) before then, please call [phone].  If you test positive for Covid 19 in the 2 weeks post procedure, please call and report this information to [place].    If any biopsies were taken you will be contacted by phone or by letter within the next 1-3 weeks.  Please call us at [phone] if you have not heard about the biopsies in 3 weeks.    SIGNATURES/CONFIDENTIALITY: You and/or your care partner have signed paperwork which will be entered into your electronic medical record.  These signatures attest to the fact that that the information above on your After Visit Summary has been reviewed and is understood.  Full responsibility of  the confidentiality of this discharge information lies with you and/or your care-partner. 

## 2019-05-18 NOTE — Progress Notes (Signed)
Called to room to assist during endoscopic procedure.  Patient ID and intended procedure confirmed with present staff. Received instructions for my participation in the procedure from the performing physician.  

## 2019-05-18 NOTE — Progress Notes (Signed)
PT taken to PACU. Monitors in place. VSS. Report given to RN. 

## 2019-05-23 ENCOUNTER — Telehealth: Payer: Self-pay

## 2019-05-23 ENCOUNTER — Other Ambulatory Visit: Payer: Self-pay

## 2019-05-23 ENCOUNTER — Ambulatory Visit: Payer: Self-pay

## 2019-05-23 NOTE — Telephone Encounter (Signed)
  Follow up Call-  Call back number 05/18/2019  Post procedure Call Back phone  # [phone]  Permission to leave phone message Yes  Some recent data might be hidden     Patient questions:  Do you have a fever, pain , or abdominal swelling? No. Pain Score  0 *  Have you tolerated food without any problems? Yes.    Have you been able to return to your normal activities? Yes.    Do you have any questions about your discharge instructions: Diet   No. Medications  No. Follow up visit  No.  Do you have questions or concerns about your Care? No.  Actions: * If pain score is 4 or above: No action needed, pain <4. 1. Have you developed a fever since your procedure? no  2.   Have you had an respiratory symptoms (SOB or cough) since your procedure? no  3.   Have you tested positive for COVID 19 since your procedure no  4.   Have you had any family members/close contacts diagnosed with the COVID 19 since your procedure?  no   If yes to any of these questions please route to Joylene John, RN and Alphonsa Gin, Therapist, sports.

## 2019-06-03 ENCOUNTER — Encounter: Payer: Self-pay | Admitting: Gastroenterology

## 2019-06-27 ENCOUNTER — Encounter: Payer: Self-pay | Admitting: Internal Medicine

## 2019-07-17 ENCOUNTER — Encounter: Payer: Self-pay | Admitting: Internal Medicine

## 2020-11-02 IMAGING — DX PORTABLE CHEST - 1 VIEW
2 series · 2 of 2 positions shown · non-contrast
Comparison: None.

CLINICAL DATA: Chest pain, epigastric pain

EXAM:
PORTABLE CHEST 1 VIEW

[chest ap (1 of 2)]
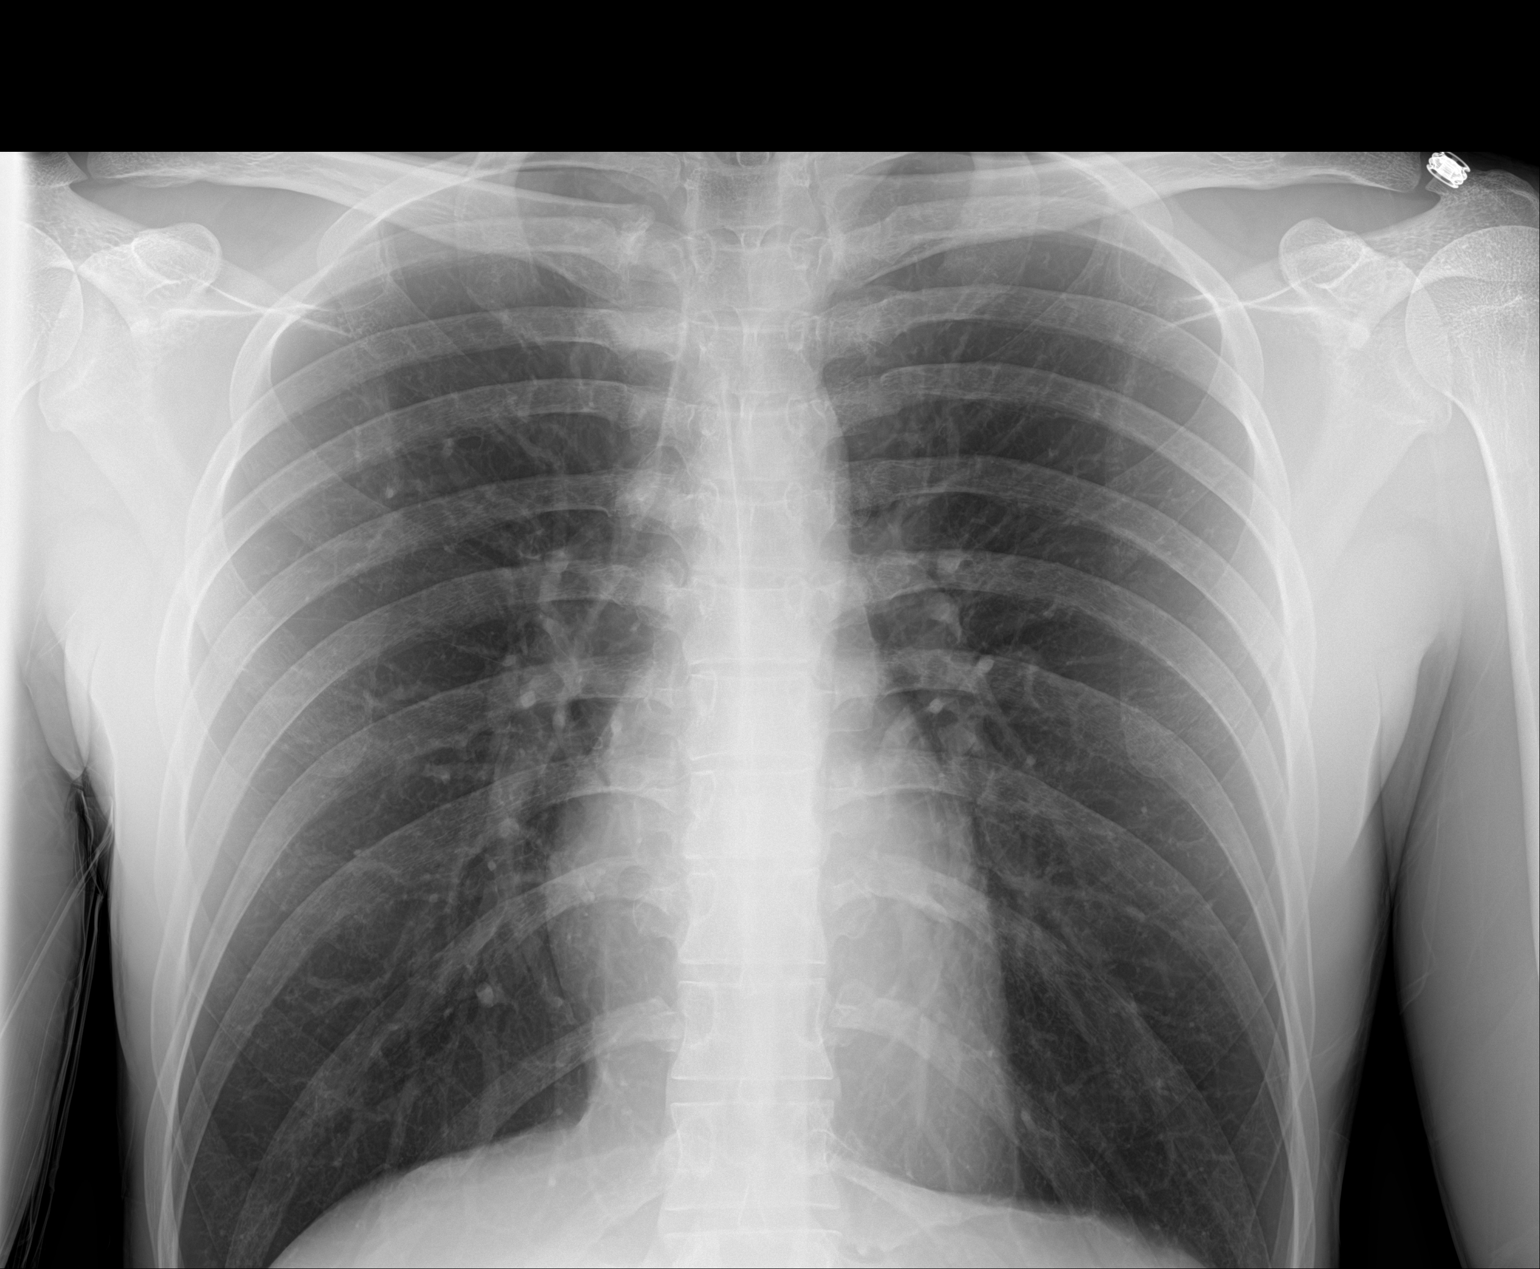

[chest ap (2 of 2)]
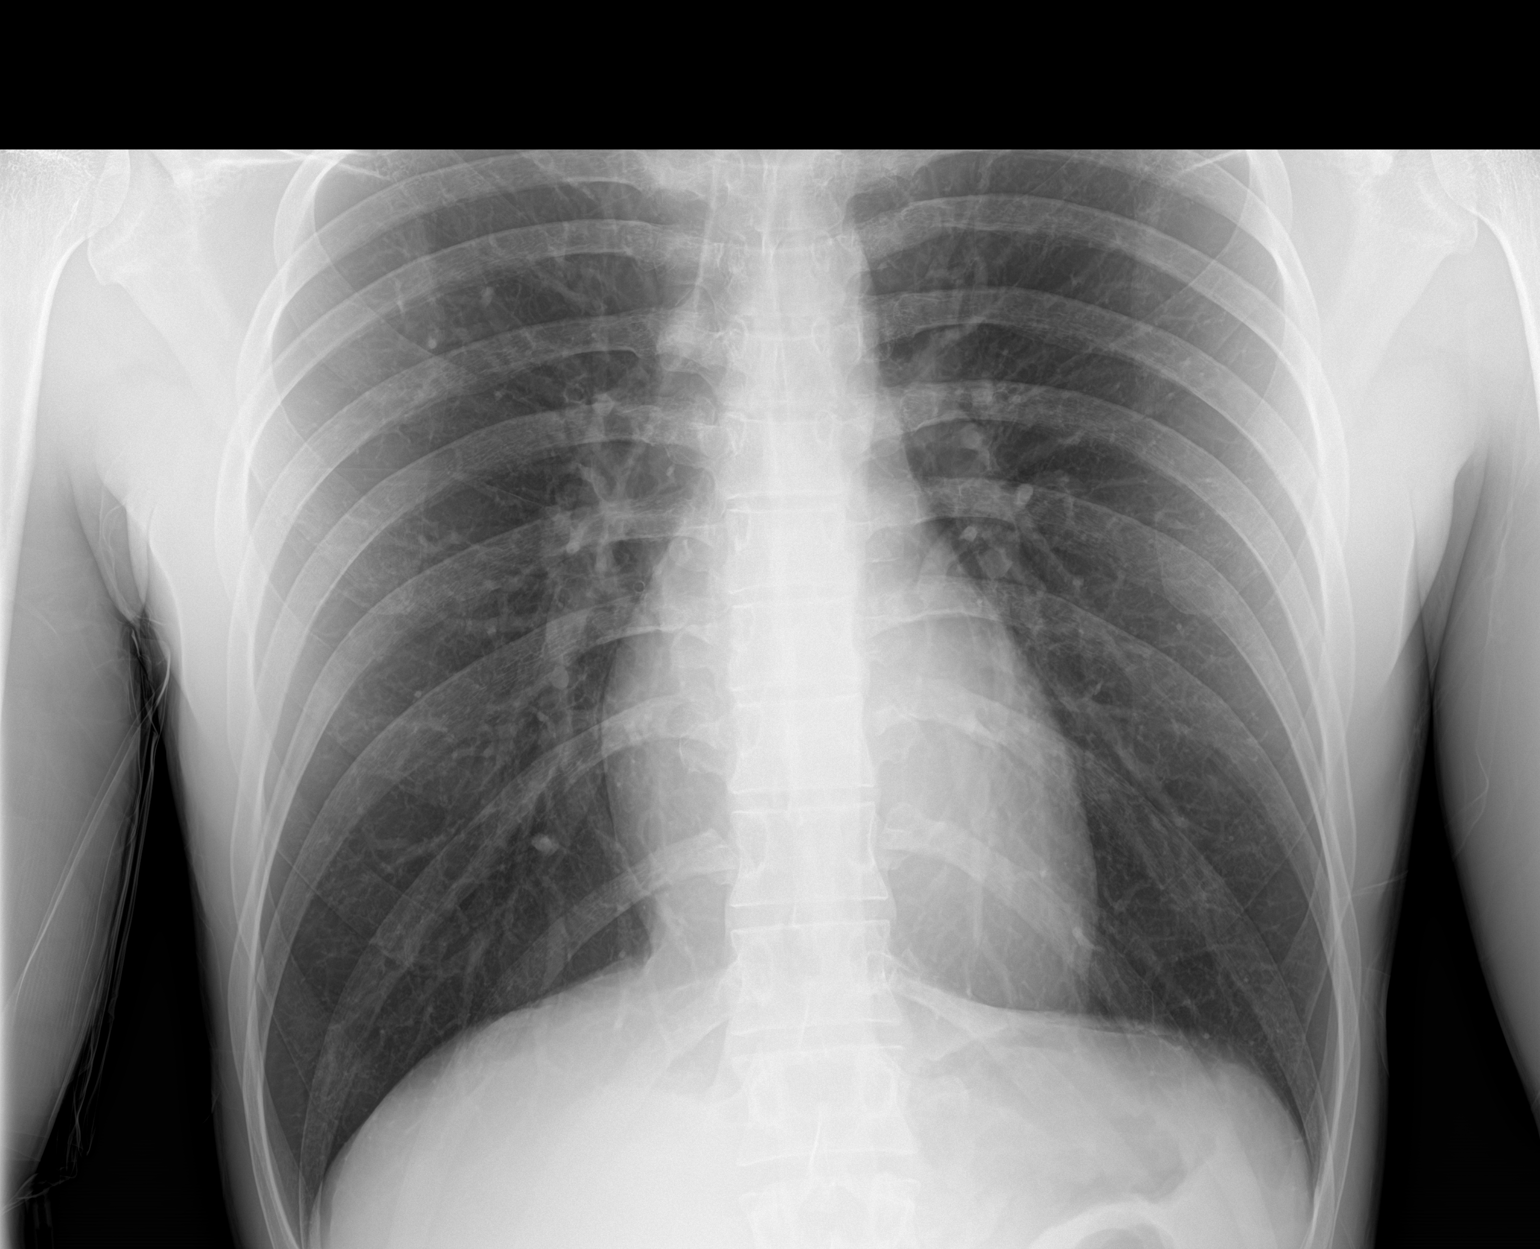

[2 of 2 positions shown; findings below may reference images not displayed]

FINDINGS: The heart size and mediastinal contours are within normal limits.
Both lungs are clear. No pneumothorax or effusion the portion of the
right costophrenic sulcus is collimated from view. The visualized
skeletal structures are unremarkable.
IMPRESSION: No active disease.
